# Patient Record
Sex: Male | Born: 2000 | Hispanic: No | Marital: Single | Smoking: Never smoker
Health system: Southern US, Community
[De-identification: ages and names within clinical notes are randomized; demographics above are authoritative.]

---
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
## 2020-04-01 ENCOUNTER — Encounter (HOSPITAL_COMMUNITY): Payer: Self-pay

## 2020-04-01 ENCOUNTER — Emergency Department (HOSPITAL_COMMUNITY): Payer: Medicaid Other

## 2020-04-01 ENCOUNTER — Emergency Department (HOSPITAL_COMMUNITY)
Admission: EM | Admit: 2020-04-01 | Discharge: 2020-04-02 | Disposition: A | Discharge status: Home / Self Care | Payer: Medicaid Other | Attending: Emergency Medicine | Admitting: Emergency Medicine

## 2020-04-01 ENCOUNTER — Other Ambulatory Visit: Payer: Self-pay

## 2020-04-01 DIAGNOSIS — M545 Low back pain, unspecified: Secondary | ICD-10-CM | POA: Diagnosis present

## 2020-04-01 DIAGNOSIS — R1031 Right lower quadrant pain: Secondary | ICD-10-CM | POA: Insufficient documentation

## 2020-04-01 DIAGNOSIS — M62830 Muscle spasm of back: Secondary | ICD-10-CM | POA: Insufficient documentation

## 2020-04-01 LAB — COMPREHENSIVE METABOLIC PANEL
ALT: 55 U/L — ABNORMAL HIGH (ref 0–44)
AST: 39 U/L (ref 15–41)
Albumin: 5 g/dL (ref 3.5–5.0)
Alkaline Phosphatase: 83 U/L (ref 38–126)
Anion gap: 11 (ref 5–15)
BUN: 14 mg/dL (ref 6–20)
CO2: 24 mmol/L (ref 22–32)
Calcium: 9.7 mg/dL (ref 8.9–10.3)
Chloride: 105 mmol/L (ref 98–111)
Creatinine, Ser: 0.95 mg/dL (ref 0.61–1.24)
GFR, Estimated: >60 mL/min (ref >60)
Glucose, Bld: 101 mg/dL — ABNORMAL HIGH (ref 70–99)
Potassium: 4 mmol/L (ref 3.5–5.1)
Sodium: 140 mmol/L (ref 135–145)
Total Bilirubin: 0.9 mg/dL (ref 0.3–1.2)
Total Protein: 8 g/dL (ref 6.5–8.1)

## 2020-04-01 LAB — URINALYSIS, ROUTINE W REFLEX MICROSCOPIC
Bilirubin Urine: NEGATIVE
Glucose, UA: NEGATIVE mg/dL
Hgb urine dipstick: NEGATIVE
Ketones, ur: 20 mg/dL — AB
Leukocytes,Ua: NEGATIVE
Nitrite: NEGATIVE
Protein, ur: NEGATIVE mg/dL
Specific Gravity, Urine: 1.024 (ref 1.005–1.030)
pH: 5 (ref 5.0–8.0)

## 2020-04-01 LAB — CBC
HCT: 47.9 % (ref 39.0–52.0)
Hemoglobin: 16.4 g/dL (ref 13.0–17.0)
MCH: 30.7 pg (ref 26.0–34.0)
MCHC: 34.2 g/dL (ref 30.0–36.0)
MCV: 89.5 fL (ref 80.0–100.0)
Platelets: 181 10*3/uL (ref 150–400)
RBC: 5.35 MIL/uL (ref 4.22–5.81)
RDW: 13.1 % (ref 11.5–15.5)
WBC: 9.4 10*3/uL (ref 4.0–10.5)
nRBC: 0 % (ref 0.0–0.2)

## 2020-04-01 LAB — LIPASE, BLOOD: Lipase: 19 U/L (ref 11–51)

## 2020-04-01 MED ORDER — OXYCODONE-ACETAMINOPHEN 5-325 MG PO TABS
1.0000 | ORAL_TABLET | Freq: Once | ORAL | Status: AC
  Administered 2020-04-01: 1 via ORAL
  Filled 2020-04-01: qty 1

## 2020-04-01 MED ORDER — ONDANSETRON 4 MG PO TBDP
4.0000 mg | ORAL_TABLET | Freq: Once | ORAL | Status: AC
  Administered 2020-04-01: 4 mg via ORAL
  Filled 2020-04-01: qty 1

## 2020-04-01 MED ORDER — METHOCARBAMOL 500 MG PO TABS
500.0000 mg | ORAL_TABLET | Freq: Once | ORAL | Status: AC
  Administered 2020-04-01: 500 mg via ORAL
  Filled 2020-04-01: qty 1

## 2020-04-01 MED ORDER — KETOROLAC TROMETHAMINE 60 MG/2ML IM SOLN
60.0000 mg | Freq: Once | INTRAMUSCULAR | Status: AC
  Administered 2020-04-01: 60 mg via INTRAMUSCULAR
  Filled 2020-04-01: qty 2

## 2020-04-01 NOTE — ED Triage Notes (Addendum)
Pt reports flank pain that radiates to abdomen that began this morning. Pt reports the pain becomes so severe that he vomits uncontrollably. Pt denies any urinary symptoms.

## 2020-04-02 MED ORDER — METHOCARBAMOL 500 MG PO TABS: 500.0000 mg | ORAL_TABLET | Freq: Two times a day (BID) | ORAL | 0 refills | Status: AC

## 2020-04-02 NOTE — ED Provider Notes (Addendum)
[facility]-EMERGENCY DEPT Provider Note   CSN: [id number] Arrival date & time: 04/01/20  1657     History Chief Complaint  Patient presents with  . Flank Pain  . Abdominal Pain    Trevor Nguyen is a 19 y.o. male.  The history is provided by the patient.  Flank Pain This is a new problem. The current episode started 12 to 24 hours ago. The problem occurs constantly. The problem has been gradually worsening. Associated symptoms include abdominal pain. Pertinent negatives include no chest pain and no shortness of breath. Exacerbated by: movement. The symptoms are relieved by rest.  Abdominal Pain Associated symptoms: no chest pain, no dysuria, no fever and no shortness of breath   Patient presents with back and abdominal pain.  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  He also had associated right lower quadrant abdominal pain.  He denies any vomiting or diarrhea on my evaluation.  No fevers.  No chest pain or shortness of breath.  No difficulty urinating.  No leg weakness.  No urinary/fecal incontinence. No trauma, but he does do heavy lifting at work.      PMH-none Social History   Tobacco Use  . Smoking status: Never Smoker  Substance Use Topics  . Alcohol use: No  . Drug use: No    Home Medications Prior to Admission medications   Medication Sig Start Date End Date Taking? Authorizing Provider  naproxen (NAPROSYN) 500 MG tablet Take 1 tablet (500 mg total) by mouth 2 (two) times daily. 08/13/18   Mabe, Latanya Maudlin, MD    Allergies    Patient has no known allergies.  Review of Systems   Review of Systems  Constitutional: Negative for fever.  Respiratory: Negative for shortness of breath.   Cardiovascular: Negative for chest pain.  Gastrointestinal: Positive for abdominal pain.  Genitourinary: Positive for flank pain. Negative for dysuria.  Musculoskeletal: Positive for back  pain.  Neurological: Negative for weakness and numbness.  All other systems reviewed and are negative.   Physical Exam Updated Vital Signs BP 127/77 (BP Location: Left Arm)   Pulse 100   Temp 98.4 F (36.9 C) (Oral)   Resp 16   SpO2 100%   Physical Exam CONSTITUTIONAL: Well developed/well nourished HEAD: Normocephalic/atraumatic EYES: EOMI/PERRL ENMT: Mucous membranes moist NECK: supple no meningeal signs SPINE/BACK: Lumbar spinal and paraspinal tenderness, no other spinal tenderness, no bruising/crepitance/stepoffs noted to spine CV: S1/S2 noted, no murmurs/rubs/gallops noted LUNGS: Lungs are clear to auscultation bilaterally, no apparent distress ABDOMEN: soft, nontender, no rebound or guarding GU:no cva tenderness, no inguinal hernia, no scrotal tenderness or edema, chaperone present for exam NEURO: Awake/alert, no saddle anesthesia, equal motor 5/5 strength noted with the following: hip flexion/knee flexion/extension, foot dorsi/plantar flexion, great toe extension intact bilaterally, no clonus bilaterally, plantar reflex appropriate (toes downgoing), no sensory deficit in any dermatome.  Equal patellar/achilles reflex noted (2+) in bilateral lower extremities.  Pt is able to ambulate unassisted. EXTREMITIES: pulses normal, full ROM SKIN: warm, color normal PSYCH: no abnormalities of mood noted, alert and oriented to situation   ED Results / Procedures / Treatments   Labs (all labs ordered are listed, but only abnormal results are displayed) Labs Reviewed  COMPREHENSIVE METABOLIC PANEL - Abnormal; Notable for the following components:      Result Value   Glucose, Bld 101 (*)    ALT 55 (*)    All  other components within normal limits  URINALYSIS, ROUTINE W REFLEX MICROSCOPIC - Abnormal; Notable for the following components:   Ketones, ur 20 (*)    All other components within normal limits  LIPASE, BLOOD  CBC    EKG None  Radiology CT Renal Stone Study  Result  Date: 04/01/2020 CLINICAL DATA:  Flank pain radiating to the abdomen EXAM: CT ABDOMEN AND PELVIS WITHOUT CONTRAST TECHNIQUE: Multidetector CT imaging of the abdomen and pelvis was performed following the standard protocol without IV contrast. COMPARISON:  None. FINDINGS: Lower chest: The visualized heart size within normal limits. No pericardial fluid/thickening. No hiatal hernia. The visualized portions of the lungs are clear. Hepatobiliary: Although limited due to the lack of intravenous contrast, normal in appearance without gross focal abnormality. No evidence of calcified gallstones or biliary ductal dilatation. Pancreas:  Unremarkable.  No surrounding inflammatory changes. Spleen: Normal in size. Although limited due to the lack of intravenous contrast, normal in appearance. Adrenals/Urinary Tract: Both adrenal glands appear normal. The kidneys and collecting system appear normal without evidence of urinary tract calculus or hydronephrosis. Bladder is unremarkable. Stomach/Bowel: The stomach, small bowel, and colon are normal in appearance. No inflammatory changes or obstructive findings. Moderate amount stool is present. Appendix is normal. Vascular/Lymphatic: There are no enlarged abdominal or pelvic lymph nodes. No significant gross vascular findings are present given the lack of intravenous contrast. Reproductive: The prostate is unremarkable. Other: No evidence of abdominal wall mass or hernia. Musculoskeletal: No acute or significant osseous findings. 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 IMPRESSION: No renal or collecting system calculi. Normal appearing appendix. Electronically Signed   By: Jonna Clark M.D.   On: 04/01/2020 19:14    Procedures Procedures (including critical care time)  Medications Ordered in ED Medications  oxyCODONE-acetaminophen (PERCOCET/ROXICET) 5-325 MG per tablet 1 tablet (1 tablet Oral Given 04/01/20 1810)  ondansetron (ZOFRAN-ODT) disintegrating tablet 4 mg (4 mg  Oral Given 04/01/20 1811)  ketorolac (TORADOL) injection 60 mg (60 mg Intramuscular Given 04/01/20 2359)  methocarbamol (ROBAXIN) tablet 500 mg (500 mg Oral Given 04/01/20 2359)    ED Course  I have reviewed the triage vital signs and the nursing notes.  Pertinent labs & imaging results that were available during my care of the patient were reviewed by me and considered in my medical decision making (see chart for details).    MDM Rules/Calculators/A&P                          12:09 AM Patient presents with back and abdominal pain.  There was initial concern for ureteral stone and patient had a CT scan ordered.  All imaging is negative.  Labs are overall reassuring.  His exam appears more consistent with muscle spasm of the back.  Abdominal and GU exam is unremarkable. Plan to treat his pain and reassess 1:05 AM Patient improved, no acute distress.  No neuro deficits.  He does not request a work note. Will give short course of muscle relaxant.  We discussed return precaution  Final Clinical Impression(s) / ED Diagnoses Final diagnoses:  Muscle spasm of back    Rx / DC Orders ED Discharge Orders         Ordered    methocarbamol (ROBAXIN) 500 MG tablet  2 times daily        04/02/20 0055           Zadie Rhine, MD 04/02/20 0105    Zadie Rhine,  MD 04/02/20 0106

## 2020-04-02 NOTE — Discharge Instructions (Addendum)

## 2020-09-06 ENCOUNTER — Other Ambulatory Visit: Payer: Self-pay

## 2020-09-06 ENCOUNTER — Emergency Department (HOSPITAL_COMMUNITY): Payer: Medicaid Other

## 2020-09-06 ENCOUNTER — Emergency Department (HOSPITAL_COMMUNITY)
Admission: EM | Admit: 2020-09-06 | Discharge: 2020-09-06 | Disposition: A | Discharge status: Home / Self Care | Payer: Medicaid Other | Attending: Emergency Medicine | Admitting: Emergency Medicine

## 2020-09-06 ENCOUNTER — Encounter (HOSPITAL_COMMUNITY): Payer: Self-pay

## 2020-09-06 DIAGNOSIS — Z23 Encounter for immunization: Secondary | ICD-10-CM | POA: Insufficient documentation

## 2020-09-06 DIAGNOSIS — S61432A Puncture wound without foreign body of left hand, initial encounter: Secondary | ICD-10-CM | POA: Insufficient documentation

## 2020-09-06 DIAGNOSIS — W540XXA Bitten by dog, initial encounter: Secondary | ICD-10-CM | POA: Insufficient documentation

## 2020-09-06 DIAGNOSIS — S6992XA Unspecified injury of left wrist, hand and finger(s), initial encounter: Secondary | ICD-10-CM | POA: Diagnosis present

## 2020-09-06 MED ORDER — TETANUS-DIPHTH-ACELL PERTUSSIS 5-2.5-18.5 LF-MCG/0.5 IM SUSY
0.5000 mL | PREFILLED_SYRINGE | Freq: Once | INTRAMUSCULAR | Status: AC
  Administered 2020-09-06: 0.5 mL via INTRAMUSCULAR
  Filled 2020-09-06: qty 0.5

## 2020-09-06 MED ORDER — AMOXICILLIN-POT CLAVULANATE 875-125 MG PO TABS
1.0000 | ORAL_TABLET | Freq: Once | ORAL | Status: AC
  Administered 2020-09-06: 1 via ORAL
  Filled 2020-09-06: qty 1

## 2020-09-06 MED ORDER — RABIES VACCINE, PCEC IM SUSR
1.0000 mL | Freq: Once | INTRAMUSCULAR | Status: AC
  Administered 2020-09-06: 1 mL via INTRAMUSCULAR
  Filled 2020-09-06: qty 1

## 2020-09-06 MED ORDER — AMOXICILLIN-POT CLAVULANATE 875-125 MG PO TABS: 1.0000 | ORAL_TABLET | Freq: Two times a day (BID) | ORAL | 0 refills | Status: AC

## 2020-09-06 MED ORDER — RABIES IMMUNE GLOBULIN 150 UNIT/ML IM INJ
20.0000 [IU]/kg | INJECTION | Freq: Once | INTRAMUSCULAR | Status: DC
  Filled 2020-09-06 (×2): qty 10

## 2020-09-06 NOTE — ED Triage Notes (Signed)
Pt arrived POV c/o of a dog bite. Pt stated it happened around 6pm yesterday. Pt with a puncture wound to left hand between his thumb. Pt stated he woke up and it was swollen this morning so that concerned him.  Pt is not sure who the dog belongs to and so does not know if they were up to date on shots. Pt denies pain but states his pointer finger feels numb.

## 2020-09-06 NOTE — ED Provider Notes (Incomplete)
[facility] EMERGENCY DEPARTMENT Provider Note   CSN: [id number] Arrival date & time: 09/06/20  1119     History Chief Complaint  Patient presents with  . Animal Bite    Trevor Nguyen is a 20 y.o. male.  HPI     History reviewed. No pertinent past medical history.  There are no problems to display for this patient.   History reviewed. No pertinent surgical history.     History reviewed. No pertinent family history.  Social History   Tobacco Use  . Smoking status: Never Smoker  Substance Use Topics  . Alcohol use: No  . Drug use: No    Home Medications Prior to Admission medications   Medication Sig Start Date End Date Taking? Authorizing Provider  amoxicillin-clavulanate (AUGMENTIN) 875-125 MG tablet Take 1 tablet by mouth 2 (two) times daily. One po bid x 7 days 09/06/20  Yes Dartha Lodge, PA-C  methocarbamol (ROBAXIN) 500 MG tablet Take 1 tablet (500 mg total) by mouth 2 (two) times daily. 04/02/20   Zadie Rhine, MD  naproxen (NAPROSYN) 500 MG tablet Take 1 tablet (500 mg total) by mouth 2 (two) times daily. 08/13/18   Mabe, Latanya Maudlin, MD    Allergies    Patient has no known allergies.  Review of Systems   Review of Systems  Physical Exam Updated Vital Signs BP 109/64 (BP Location: Right Arm)   Pulse 73   Temp (!) 97.3 F (36.3 C) (Oral)   Resp 20   Ht 5\' 11"  (1.803 m)   Wt 70.8 kg   SpO2 100%   BMI 21.76 kg/m   Physical Exam Vitals and nursing note reviewed.  Constitutional:      General: He is not in acute distress.    Appearance: Normal appearance. He is well-developed and normal weight. He is not ill-appearing or diaphoretic.  HENT:     Head: Normocephalic and atraumatic.  Eyes:     General:        Right eye: No discharge.        Left eye: No discharge.  Pulmonary:     Effort: Pulmonary effort is normal. No respiratory distress.  Skin:    General: Skin is warm and dry.  Neurological:      Mental Status: He is alert.     Coordination: Coordination normal.  Psychiatric:        Behavior: Behavior normal.     ED Results / Procedures / Treatments   Labs (all labs ordered are listed, but only abnormal results are displayed) Labs Reviewed - No data to display  EKG None  Radiology DG Hand Complete Left  Result Date: 09/06/2020 CLINICAL DATA:  Dog bite to left hand between thumb and index finger yesterday, r/o FB EXAM: LEFT HAND - COMPLETE 3+ VIEW COMPARISON:  None. FINDINGS: There is no evidence of fracture or dislocation. There is no evidence of arthropathy or other focal bone abnormality. There is soft tissue swelling between the first and second digit. No radiopaque foreign body. IMPRESSION: No acute osseous abnormality in the left hand. No radiopaque foreign body. Electronically Signed   By: 11/06/2020 M.D.   On: 09/06/2020 12:16    Procedures Procedures {Remember to document critical care time when appropriate:1}  Medications Ordered in ED Medications  rabies vaccine (RABAVERT) injection 1 mL (1 mL Intramuscular Given 09/06/20 1332)  amoxicillin-clavulanate (AUGMENTIN) 875-125 MG per tablet 1 tablet (1 tablet Oral Given 09/06/20 1330)  Tdap (BOOSTRIX)  injection 0.5 mL (0.5 mLs Intramuscular Given 09/06/20 1331)    ED Course  I have reviewed the triage vital signs and the nursing notes.  Pertinent labs & imaging results that were available during my care of the patient were reviewed by me and considered in my medical decision making (see chart for details).    MDM Rules/Calculators/A&P                         20 year old male presents after dog bite that occurred yesterday evening, 1 small puncture wound to the left hand.  Unsure of dog's vaccination status and is not sure of his last tetanus.  Did have full prior rabies series in 2016.  Will be given booster dose of rabies vaccination here today and will repeat second booster in 3 days, tetanus updated as well.   The area was cleaned and irrigated.  Small puncture wound has already scabbed over, there is some slight soft tissue wound I will follow with you follow-up Swelling but no significant tenderness or palpable fluctuance or fluid collection.  X-ray negative for foreign body or bony abnormality.  Low suspicion for deep space infection in the hand.  Will treat with Augmentin, have patient do warm compresses and wound care and follow-up in 48 hours if there is no improvement or any worsening of swelling or pain to the hand.  Strict return precautions discussed with patient and he expresses understanding and agreement.  We will follow-up in 3 days for second rabies booster.  Discharged home in good condition.  Final Clinical Impression(s) / ED Diagnoses Final diagnoses:  Dog bite, initial encounter    Rx / DC Orders ED Discharge Orders         Ordered    amoxicillin-clavulanate (AUGMENTIN) 875-125 MG tablet  2 times daily        09/06/20 1244

## 2020-09-06 NOTE — ED Notes (Signed)
Pt's wound has been irrigated and cleaned.

## 2020-09-06 NOTE — ED Provider Notes (Signed)
[facility] EMERGENCY DEPARTMENT Provider Note   CSN: [id number] Arrival date & time: 09/06/20  1119     History Chief Complaint  Patient presents with  . Animal Bite    Trevor Nguyen Trevor Nguyen is a 20 y.o. male.  Trevor Nguyen Trevor Nguyen is a 20 y.o. male who is otherwise healthy, presents for evaluation of a dog bite to the left hand.  Patient reports this occurred around 6 PM yesterday evening as he was leaving work.  He reports that the dog came up and he does not know who will belong to or if it was up-to-date on vaccinations.  Reports he has a single puncture wound to the dorsum of the left hand.  Cleaned this yesterday with hydrogen peroxide, last night noticed some slight swelling that he was worried about this morning so decided to come in for evaluation.  Does report that back in 2016 he had another dog bite and received the rabies vaccination series which he completed.  Unsure of last tetanus vaccination.        History reviewed. No pertinent past medical history.  There are no problems to display for this patient.   History reviewed. No pertinent surgical history.     History reviewed. No pertinent family history.  Social History   Tobacco Use  . Smoking status: Never Smoker  Substance Use Topics  . Alcohol use: No  . Drug use: No    Home Medications Prior to Admission medications   Medication Sig Start Date End Date Taking? Authorizing Provider  amoxicillin-clavulanate (AUGMENTIN) 875-125 MG tablet Take 1 tablet by mouth 2 (two) times daily. One po bid x 7 days 09/06/20  Yes Dartha Lodge, PA-C  methocarbamol (ROBAXIN) 500 MG tablet Take 1 tablet (500 mg total) by mouth 2 (two) times daily. 04/02/20   Zadie Rhine, MD  naproxen (NAPROSYN) 500 MG tablet Take 1 tablet (500 mg total) by mouth 2 (two) times daily. 08/13/18   Mabe, Latanya Maudlin, MD    Allergies    Patient has no known allergies.  Review of Systems   Review  of Systems  Constitutional: Negative for chills and fever.  Skin: Positive for wound.  Neurological: Negative for weakness and numbness.    Physical Exam Updated Vital Signs BP 109/64 (BP Location: Right Arm)   Pulse 73   Temp (!) 97.3 F (36.3 C) (Oral)   Resp 20   Ht 5\' 11"  (1.803 m)   Wt 70.8 kg   SpO2 100%   BMI 21.76 kg/m   Physical Exam Vitals and nursing note reviewed.  Constitutional:      General: He is not in acute distress.    Appearance: Normal appearance. He is well-developed and normal weight. He is not ill-appearing or diaphoretic.  HENT:     Head: Normocephalic and atraumatic.  Eyes:     General:        Right eye: No discharge.        Left eye: No discharge.  Pulmonary:     Effort: Pulmonary effort is normal. No respiratory distress.  Musculoskeletal:        General: Tenderness present.     Comments: Small puncture wound present on the dorsum on the hand between the first and second fingers, small amount of soft tissue swelling, but no fluctuance, crepitus or expressible drainage, minimally tender to palpation. No erythema or warmth  Skin:    General: Skin is warm and dry.  Neurological:  Mental Status: He is alert.     Coordination: Coordination normal.  Psychiatric:        Mood and Affect: Mood normal.        Behavior: Behavior normal.     ED Results / Procedures / Treatments   Labs (all labs ordered are listed, but only abnormal results are displayed) Labs Reviewed - No data to display  EKG None  Radiology DG Hand Complete Left  Result Date: 09/06/2020 CLINICAL DATA:  Dog bite to left hand between thumb and index finger yesterday, r/o FB EXAM: LEFT HAND - COMPLETE 3+ VIEW COMPARISON:  None. FINDINGS: There is no evidence of fracture or dislocation. There is no evidence of arthropathy or other focal bone abnormality. There is soft tissue swelling between the first and second digit. No radiopaque foreign body. IMPRESSION: No acute osseous  abnormality in the left hand. No radiopaque foreign body. Electronically Signed   By: Emmaline Kluver M.D.   On: 09/06/2020 12:16    Procedures Procedures   Medications Ordered in ED Medications  rabies vaccine (RABAVERT) injection 1 mL (1 mL Intramuscular Given 09/06/20 1332)  amoxicillin-clavulanate (AUGMENTIN) 875-125 MG per tablet 1 tablet (1 tablet Oral Given 09/06/20 1330)  Tdap (BOOSTRIX) injection 0.5 mL (0.5 mLs Intramuscular Given 09/06/20 1331)    ED Course  I have reviewed the triage vital signs and the nursing notes.  Pertinent labs & imaging results that were available during my care of the patient were reviewed by me and considered in my medical decision making (see chart for details).    MDM Rules/Calculators/A&P                         20 year old male presents after dog bite that occurred yesterday evening, 1 small puncture wound to the left hand.  Unsure of dog's vaccination status and is not sure of his last tetanus.  Did have full prior rabies series in 2016.  Will be given booster dose of rabies vaccination here today and will repeat second booster in 3 days, tetanus updated as well.  The area was cleaned and irrigated.  Small puncture wound has already scabbed over, there is some slight soft tissue wound I will follow with you follow-up Swelling but no significant tenderness or palpable fluctuance or fluid collection.  X-ray negative for foreign body or bony abnormality.  Low suspicion for deep space infection in the hand.  Will treat with Augmentin, have patient do warm compresses and wound care and follow-up in 48 hours if there is no improvement or any worsening of swelling or pain to the hand.  Strict return precautions discussed with patient and he expresses understanding and agreement.  We will follow-up in 3 days for second rabies booster.  Discharged home in good condition.  Final Clinical Impression(s) / ED Diagnoses Final diagnoses:  Dog bite, initial  encounter    Rx / DC Orders ED Discharge Orders         Ordered    amoxicillin-clavulanate (AUGMENTIN) 875-125 MG tablet  2 times daily        09/06/20 1244           Dartha Lodge, [place] 09/07/20 0044    Virgina Norfolk, DO 09/07/20 0708

## 2020-09-06 NOTE — Discharge Instructions (Addendum)
Take antibiotics twice daily for the next week. Keep area clean and apply warm compresses. If after 48 hours of antibiotics, swelling and pain are worsening or not improving, please come back for reevaluation.   Go to [facility] in 3 days for 2nd rabies booster

## 2021-05-06 ENCOUNTER — Encounter (HOSPITAL_COMMUNITY): Payer: Self-pay | Admitting: Emergency Medicine

## 2021-05-06 ENCOUNTER — Ambulatory Visit (HOSPITAL_COMMUNITY)
Admission: EM | Admit: 2021-05-06 | Discharge: 2021-05-06 | Disposition: A | Discharge status: Home / Self Care | Payer: Medicaid Other | Attending: Physician Assistant | Admitting: Physician Assistant

## 2021-05-06 DIAGNOSIS — R1031 Right lower quadrant pain: Secondary | ICD-10-CM | POA: Diagnosis not present

## 2021-05-06 DIAGNOSIS — M545 Low back pain, unspecified: Secondary | ICD-10-CM | POA: Diagnosis not present

## 2021-05-06 LAB — POCT URINALYSIS DIPSTICK, ED / UC
Bilirubin Urine: NEGATIVE
Glucose, UA: NEGATIVE mg/dL
Hgb urine dipstick: NEGATIVE
Ketones, ur: NEGATIVE mg/dL
Leukocytes,Ua: NEGATIVE
Nitrite: NEGATIVE
Protein, ur: NEGATIVE mg/dL
Specific Gravity, Urine: 1.01 (ref 1.005–1.030)
Urobilinogen, UA: 0.2 mg/dL (ref 0.0–1.0)
pH: 7 (ref 5.0–8.0)

## 2021-05-06 MED ORDER — CYCLOBENZAPRINE HCL 10 MG PO TABS: 10.0000 mg | ORAL_TABLET | Freq: Two times a day (BID) | ORAL | 0 refills | Status: AC | PRN

## 2021-05-06 MED ORDER — PREDNISONE 20 MG PO TABS: 40.0000 mg | ORAL_TABLET | Freq: Every day | ORAL | 0 refills | Status: AC

## 2021-05-06 NOTE — ED Provider Notes (Signed)
Trevor Nguyen    CSN: [id number] Arrival date & time: 05/06/21  1457      History   Chief Complaint Chief Complaint  Patient presents with   Abdominal Pain   Back Pain    HPI Trevor Nguyen is a 21 y.o. male.   Patient presents today for evaluation of left low back pain, right groin pain that started over a week ago. He denies any known injury but reports he does do heavy lifting at his job as he works in [employer]. He denies any swelling or bulges. He has not had any nausea, vomiting, diarrhea, constipation or blood in stool. He denies any dysuria. He is not concerned for STDs and denies any penile discharge or genital lesions/ rash. He reports certain positions, ie sitting, makes pain worse. He has tried tylenol without significant relief.  The history is provided by the patient.   History reviewed. No pertinent past medical history.  There are no problems to display for this patient.   History reviewed. No pertinent surgical history.     Home Medications    Prior to Admission medications   Medication Sig Start Date End Date Taking? Authorizing Provider  cyclobenzaprine (FLEXERIL) 10 MG tablet Take 1 tablet (10 mg total) by mouth 2 (two) times daily as needed for muscle spasms. 05/06/21  Yes Francene Finders, PA-C  predniSONE (DELTASONE) 20 MG tablet Take 2 tablets (40 mg total) by mouth daily with breakfast for 5 days. 05/06/21 05/11/21 Yes Francene Finders, PA-C  amoxicillin-clavulanate (AUGMENTIN) 875-125 MG tablet Take 1 tablet by mouth 2 (two) times daily. One po bid x 7 days 09/06/20   Jacqlyn Larsen, PA-C  methocarbamol (ROBAXIN) 500 MG tablet Take 1 tablet (500 mg total) by mouth 2 (two) times daily. 04/02/20   Ripley Fraise, MD  naproxen (NAPROSYN) 500 MG tablet Take 1 tablet (500 mg total) by mouth 2 (two) times daily. 08/13/18   Mabe, Forbes Cellar, MD    Family History History reviewed. No pertinent family history.  Social History Social  History   Tobacco Use   Smoking status: Never  Substance Use Topics   Alcohol use: No   Drug use: No     Allergies   Patient has no known allergies.   Review of Systems Review of Systems  Constitutional:  Negative for chills and fever.  Eyes:  Negative for discharge and redness.  Respiratory:  Negative for shortness of breath.   Gastrointestinal:  Negative for abdominal pain, blood in stool, constipation, diarrhea, nausea and vomiting.  Genitourinary:  Negative for dysuria, hematuria, penile discharge, penile pain, penile swelling and scrotal swelling.  Musculoskeletal:  Positive for back pain and myalgias.    Physical Exam Triage Vital Signs ED Triage Vitals  Enc Vitals Group     BP      Pulse      Resp      Temp      Temp src      SpO2      Weight      Height      Head Circumference      Peak Flow      Pain Score      Pain Loc      Pain Edu?      Excl. in [place]?    No data found.  Updated Vital Signs BP 126/79 (BP Location: Right Arm)    Pulse (!) 112    Temp 98.4 F (36.9  C)    Resp 16    SpO2 99%      Physical Exam Vitals and nursing note reviewed.  Constitutional:      General: He is not in acute distress.    Appearance: Normal appearance. He is not ill-appearing.  HENT:     Head: Normocephalic and atraumatic.  Eyes:     Conjunctiva/sclera: Conjunctivae normal.  Cardiovascular:     Rate and Rhythm: Normal rate and regular rhythm.     Heart sounds: Normal heart sounds. No murmur heard. Pulmonary:     Effort: Pulmonary effort is normal. No respiratory distress.     Breath sounds: Normal breath sounds. No wheezing, rhonchi or rales.  Abdominal:     General: Abdomen is flat. Bowel sounds are normal. There is no distension.     Palpations: Abdomen is soft.     Tenderness: There is no abdominal tenderness. There is no guarding or rebound.  Musculoskeletal:     Comments: Patient states that palpation to left low back feels more like "massage" and  actually seems to make pain somewhat better, no midline spine TTP  Skin:    General: Skin is warm and dry.  Neurological:     Mental Status: He is alert.  Psychiatric:        Mood and Affect: Mood normal.        Behavior: Behavior normal.     UC Treatments / Results  Labs (all labs ordered are listed, but only abnormal results are displayed) Labs Reviewed  POCT URINALYSIS DIPSTICK, ED / UC    EKG   Radiology No results found.  Procedures Procedures (including critical care time)  Medications Ordered in UC Medications - No data to display  Initial Impression / Assessment and Plan / UC Course  I have reviewed the triage vital signs and the nursing notes.  Pertinent labs & imaging results that were available during my care of the patient were reviewed by me and considered in my medical decision making (see chart for details).    Will trial treatment with steroid burst and muscle relaxer. Recommend follow up if no improvement with same or if symptoms worsen in any way.  Final Clinical Impressions(s) / UC Diagnoses   Final diagnoses:  Acute left-sided low back pain without sciatica  Right groin pain     Discharge Instructions      Please follow up if symptoms fail to improve or worsen in any way.     ED Prescriptions     Medication Sig Dispense Auth. Provider   predniSONE (DELTASONE) 20 MG tablet Take 2 tablets (40 mg total) by mouth daily with breakfast for 5 days. 10 tablet Ewell Poe F, PA-C   cyclobenzaprine (FLEXERIL) 10 MG tablet Take 1 tablet (10 mg total) by mouth 2 (two) times daily as needed for muscle spasms. 20 tablet Francene Finders, PA-C      PDMP not reviewed this encounter.   Francene Finders, PA-C 05/06/21 1628

## 2021-05-06 NOTE — ED Triage Notes (Signed)
Over week having RLQ and left flank pains. Denies n/v, urinary or bowel problems.

## 2021-05-06 NOTE — Discharge Instructions (Signed)
Please follow up if symptoms fail to improve or worsen in any way.

## 2022-06-26 IMAGING — CT CT RENAL STONE PROTOCOL
2 of 4 series · 16 of 46 positions shown, 18 images · non-contrast
Comparison: None.

CLINICAL DATA: Flank pain radiating to the abdomen

EXAM:
CT ABDOMEN AND PELVIS WITHOUT CONTRAST
TECHNIQUE: Multidetector CT imaging of the abdomen and pelvis was performed
following the standard protocol without IV contrast.

[Series 2: axial st · axial · 0.68mm/px · z∈[+88,+503]mm · 13 of 93 slices shown, 15 images]
[im 5/93  soft-tissue]
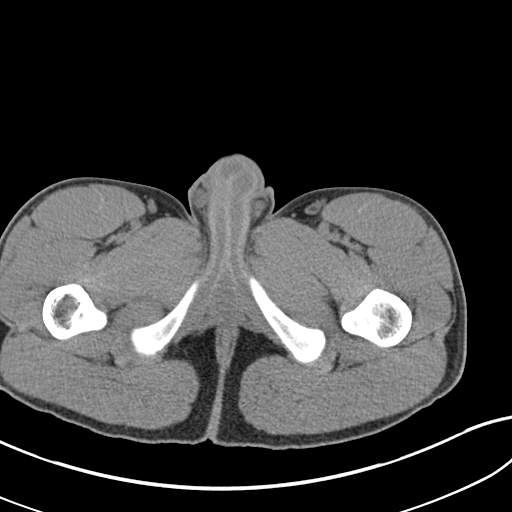
[im 5/93  bone]
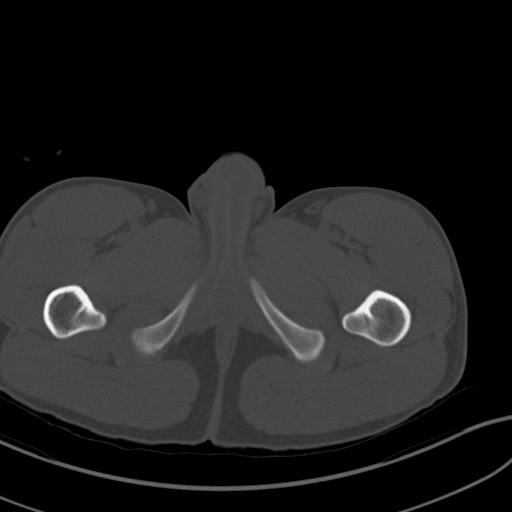
[im 14/93  soft-tissue]
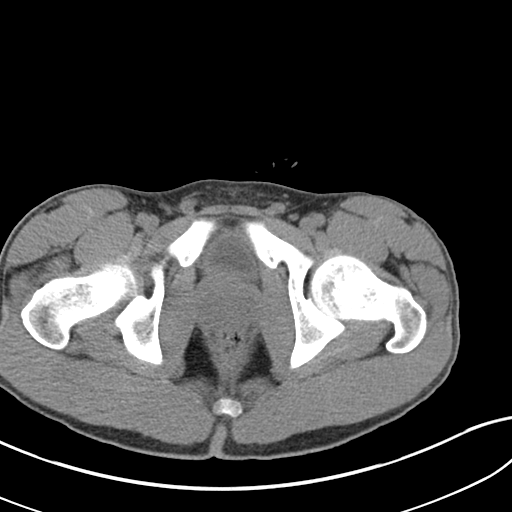
[im 19/93  soft-tissue]
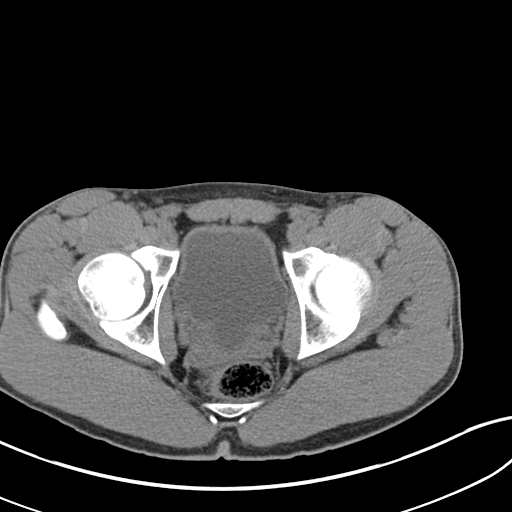
[im 28/93  soft-tissue]
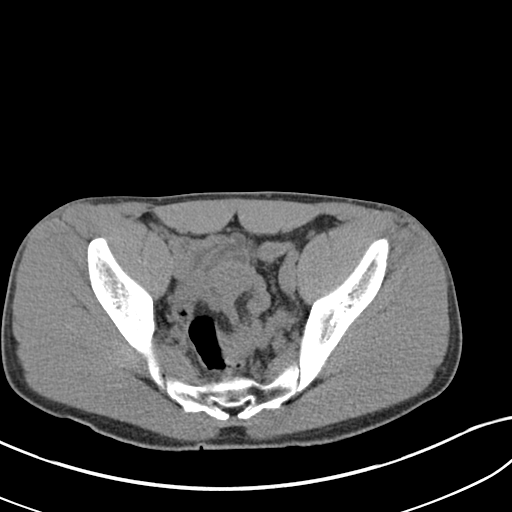
[im 33/93  soft-tissue]
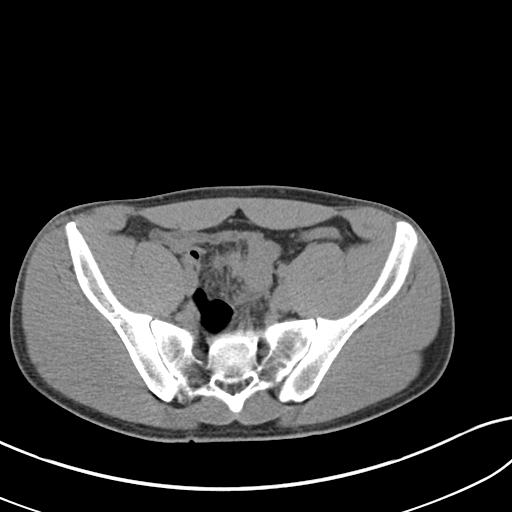
[im 42/93  soft-tissue]
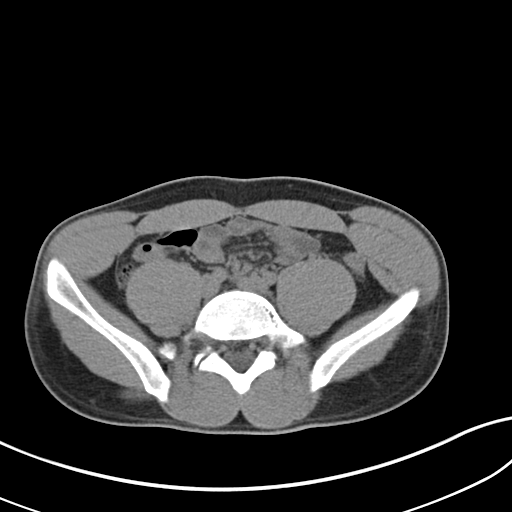
[im 47/93  soft-tissue]
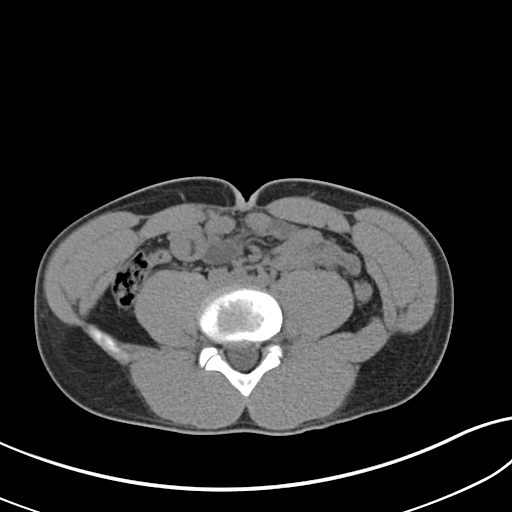
[im 51/93  soft-tissue]
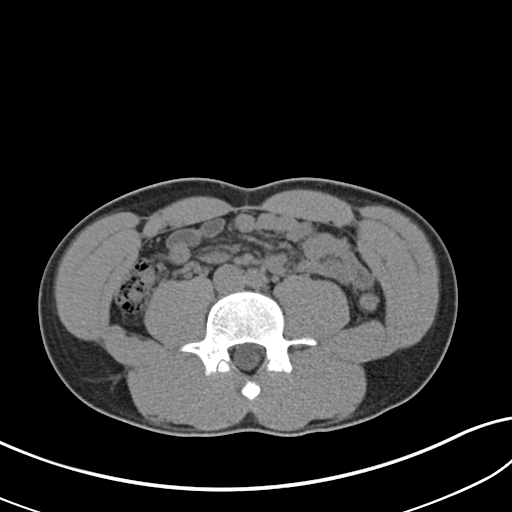
[im 60/93  soft-tissue]
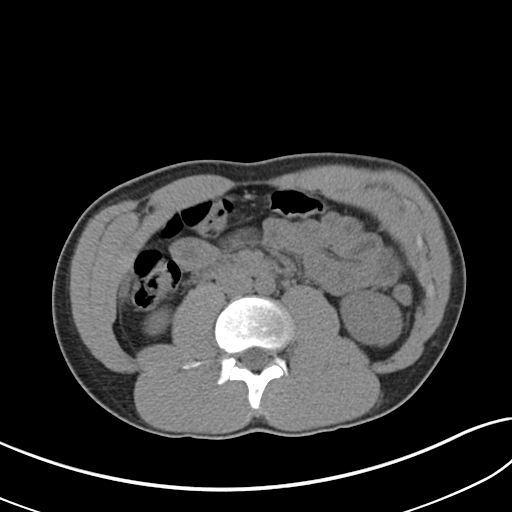
[im 60/93  bone]
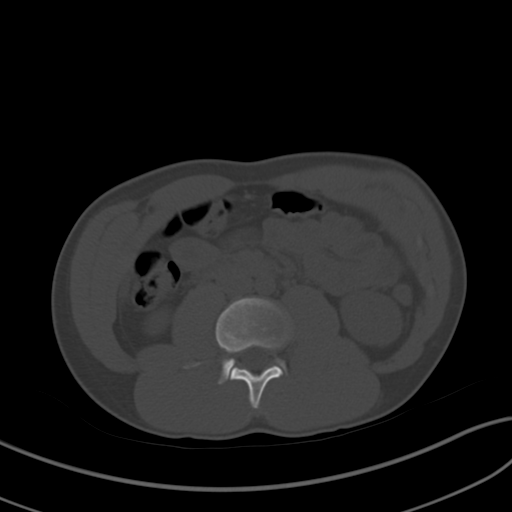
[im 65/93  soft-tissue]
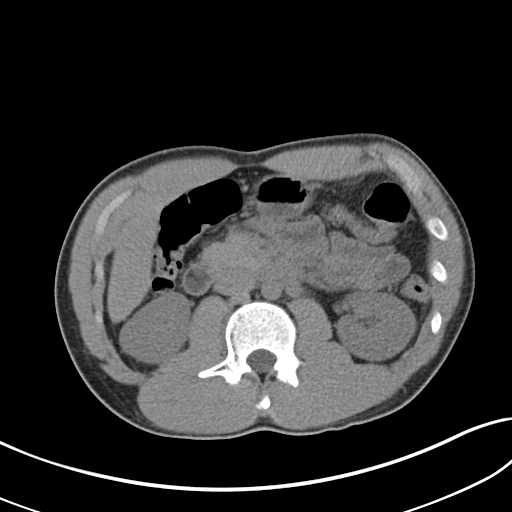
[im 74/93  soft-tissue]
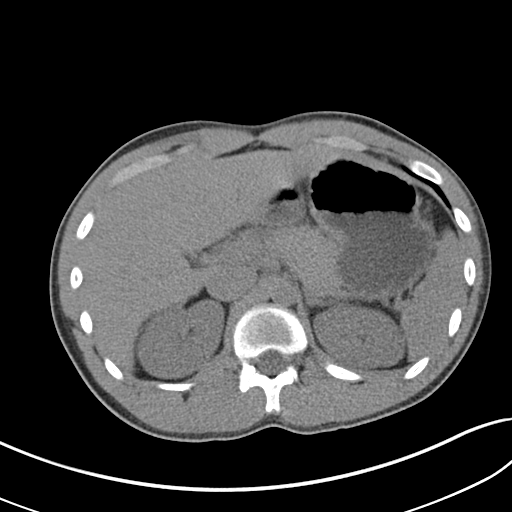
[im 79/93  soft-tissue]
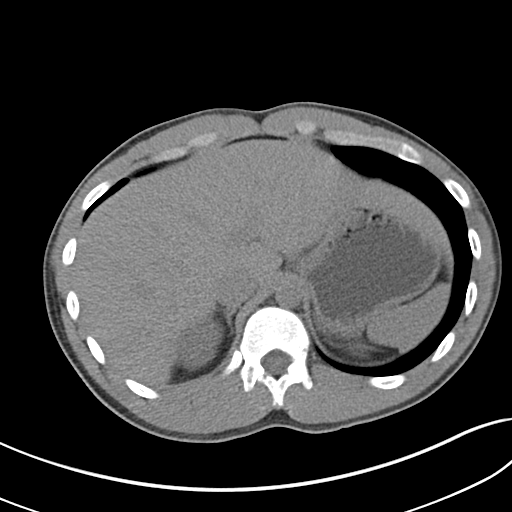
[im 88/93  soft-tissue]
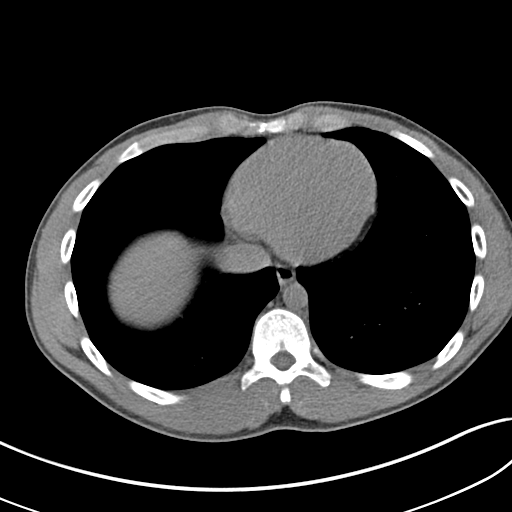

[Series 5: coronal · coronal · 0.70mm/px · 3 of 124 slices shown]
[im 42/124  soft-tissue]
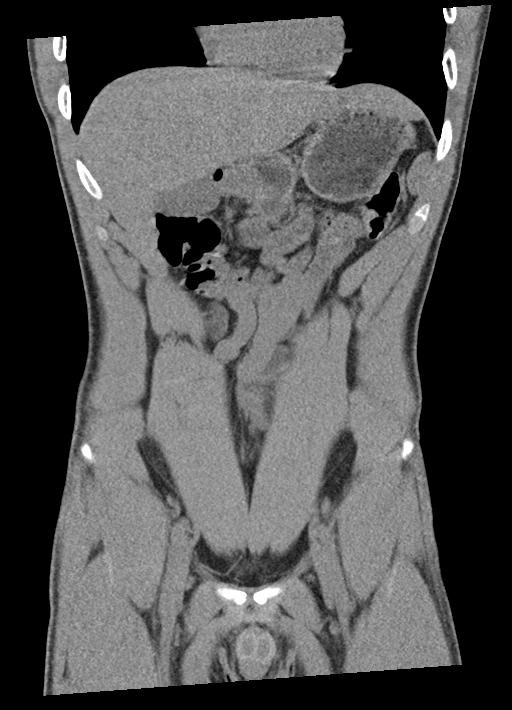
[im 55/124  soft-tissue]
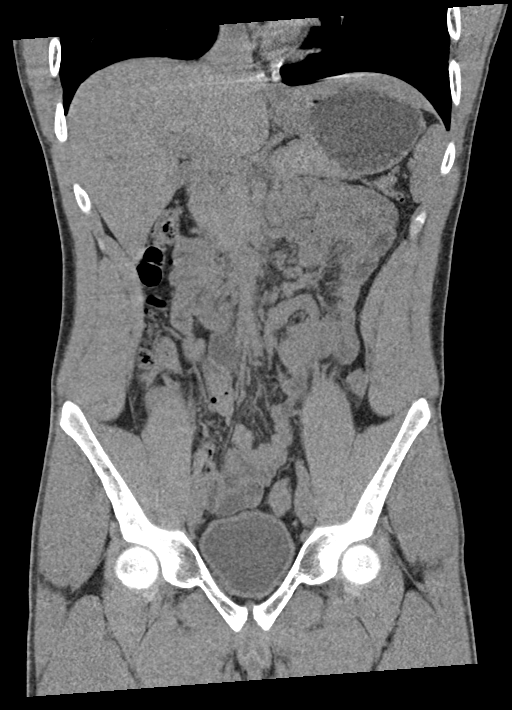
[im 69/124  soft-tissue]
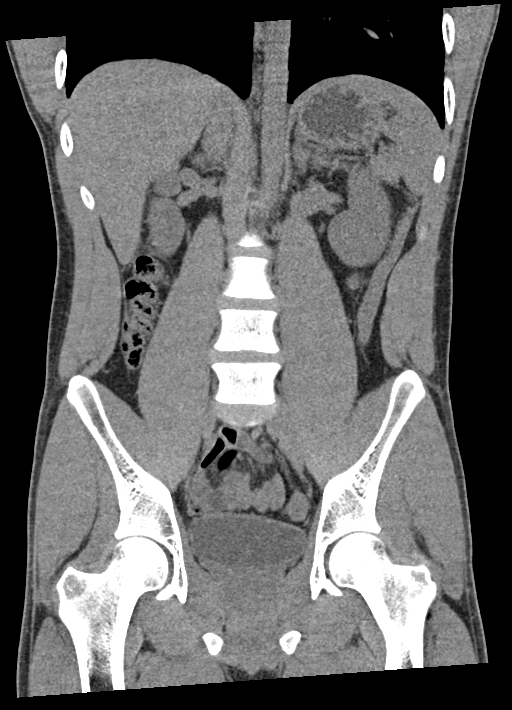

[16 of 46 positions shown; findings below may reference images not displayed]

FINDINGS: Lower chest: The visualized heart size within normal limits. No
pericardial fluid/thickening.

No hiatal hernia.

The visualized portions of the lungs are clear.

Hepatobiliary: Although limited due to the lack of intravenous
contrast, normal in appearance without gross focal abnormality. No
evidence of calcified gallstones or biliary ductal dilatation.

Pancreas:  Unremarkable.  No surrounding inflammatory changes.

Spleen: Normal in size. Although limited due to the lack of
intravenous contrast, normal in appearance.

Adrenals/Urinary Tract: Both adrenal glands appear normal. The
kidneys and collecting system appear normal without evidence of
urinary tract calculus or hydronephrosis. Bladder is unremarkable.

Stomach/Bowel: The stomach, small bowel, and colon are normal in
appearance. No inflammatory changes or obstructive findings.
Moderate amount stool is present. Appendix is normal.

Vascular/Lymphatic: There are no enlarged abdominal or pelvic lymph
nodes. No significant gross vascular findings are present given the
lack of intravenous contrast.

Reproductive: The prostate is unremarkable.

Other: No evidence of abdominal wall mass or hernia.

Musculoskeletal: No acute or significant osseous findings. Chronic
bilateral pars defects seen at L5.
IMPRESSION: No renal or collecting system calculi.

Normal appearing appendix.
